# Patient Record
Sex: Female | Born: 1980 | Race: Black or African American | Hispanic: No | Marital: Single | Smoking: Never smoker
Health system: Southern US, Community
[De-identification: ages and names within clinical notes are randomized; demographics above are authoritative.]

## PROBLEM LIST (undated history)

## (undated) DIAGNOSIS — F419 Anxiety disorder, unspecified: Secondary | ICD-10-CM

## (undated) DIAGNOSIS — T7840XA Allergy, unspecified, initial encounter: Secondary | ICD-10-CM

## (undated) HISTORY — PX: COSMETIC SURGERY: SHX468

## (undated) HISTORY — DX: Anxiety disorder, unspecified: F41.9

## (undated) HISTORY — PX: BREAST SURGERY: SHX581

## (undated) HISTORY — DX: Allergy, unspecified, initial encounter: T78.40XA

---

## 2005-05-05 ENCOUNTER — Inpatient Hospital Stay (HOSPITAL_COMMUNITY): Admission: AD | Admit: 2005-05-05 | Discharge: 2005-05-05 | Payer: Self-pay | Admitting: Obstetrics and Gynecology

## 2005-08-01 ENCOUNTER — Inpatient Hospital Stay (HOSPITAL_COMMUNITY): Admission: AD | Admit: 2005-08-01 | Discharge: 2005-08-01 | Payer: Self-pay | Admitting: *Deleted

## 2005-09-03 ENCOUNTER — Other Ambulatory Visit: Admission: RE | Admit: 2005-09-03 | Discharge: 2005-09-03 | Payer: Self-pay | Admitting: Obstetrics and Gynecology

## 2005-09-10 ENCOUNTER — Encounter: Admission: RE | Admit: 2005-09-10 | Discharge: 2005-09-10 | Payer: Self-pay | Admitting: Obstetrics and Gynecology

## 2005-09-25 ENCOUNTER — Ambulatory Visit (HOSPITAL_COMMUNITY): Admission: RE | Admit: 2005-09-25 | Discharge: 2005-09-25 | Payer: Self-pay | Admitting: General Surgery

## 2005-09-25 ENCOUNTER — Encounter (INDEPENDENT_AMBULATORY_CARE_PROVIDER_SITE_OTHER): Payer: Self-pay | Admitting: *Deleted

## 2005-09-25 ENCOUNTER — Ambulatory Visit (HOSPITAL_BASED_OUTPATIENT_CLINIC_OR_DEPARTMENT_OTHER): Admission: RE | Admit: 2005-09-25 | Discharge: 2005-09-25 | Payer: Self-pay | Admitting: General Surgery

## 2006-07-15 ENCOUNTER — Emergency Department (HOSPITAL_COMMUNITY): Admission: EM | Admit: 2006-07-15 | Discharge: 2006-07-15 | Payer: Self-pay | Admitting: Emergency Medicine

## 2006-10-07 ENCOUNTER — Emergency Department (HOSPITAL_COMMUNITY): Admission: EM | Admit: 2006-10-07 | Discharge: 2006-10-07 | Payer: Self-pay | Admitting: Emergency Medicine

## 2007-12-15 ENCOUNTER — Encounter (INDEPENDENT_AMBULATORY_CARE_PROVIDER_SITE_OTHER): Payer: Self-pay | Admitting: Plastic Surgery

## 2007-12-15 ENCOUNTER — Ambulatory Visit (HOSPITAL_BASED_OUTPATIENT_CLINIC_OR_DEPARTMENT_OTHER): Admission: RE | Admit: 2007-12-15 | Discharge: 2007-12-16 | Payer: Self-pay | Admitting: Plastic Surgery

## 2011-05-07 NOTE — Op Note (Signed)
[name], [name]              ACCOUNT NO.:  [id number]   MEDICAL RECORD NO.:  [id number]          PATIENT TYPE:  AMB   LOCATION:  DSC                          FACILITY:  [facility]   PHYSICIAN:  Mary Contogiannis, M.D.DATE OF BIRTH:  05/17/1981   DATE OF PROCEDURE:  12/15/2007  DATE OF DISCHARGE:                               OPERATIVE REPORT   PREOPERATIVE DIAGNOSIS:  Bilateral macromastia.   POSTOPERATIVE DIAGNOSIS:  Bilateral macromastia.   PROCEDURE:  Bilateral reduction mammoplasties.   ATTENDING SURGEON:  Brantley Persons, M.D.   ANESTHESIA:  General endotracheal.   ANESTHESIOLOGIST:  Burna Forts, M.D.   ESTIMATED BLOOD LOSS:  200 mL.   FLUID REPLACEMENT:  2300 mL of crystalloid.   URINE OUTPUT:  600 mL.   COMPLICATIONS:  None.   INDICATIONS FOR PROCEDURE:  The patient is a 30 year old African  American female who has bilateral macromastia that is clinically  symptomatic.  She presents to undergo bilateral reduction mammoplasties.   JUSTIFICATION FOR OVERNIGHT STAY:  Progressive pain control along with  ambulation and monitoring of nipples and breast flaps.   PROCEDURE:  The patient was marked in the preop holding area in the  pattern of Wise for the future bilateral reduction mammoplasties.  She  was then taken back to the OR, placed on the table in a supine position.  After adequate general endotracheal anesthesia was obtained, the  patient's chest and breasts were prepped with Betadine and alcohol and  draped in a sterile fashion.  The base of the breasts had been injected  with 1% lidocaine with epinephrine.  After adequate hemostasis and  anesthesia had taken effect, the procedure was begun.   Both of the breast reductions were performed in the following similar  manner.  The nipple-areolar complexes were marked with the 45-mm nipple  marker.  This was then incised and the skin de-epithelialized around the  nipple-areolar complex down to the  inframammary crease in the inferior  pedicle pattern.  Next the medial, superior and lateral skin flaps were  elevated down to the chest wall.  The excess fat and glandular tissue  were removed from the inferior pedicle.  The nipple-areolar complex was  examined and found to be pink and viable.  The wound was then irrigated  with saline irrigation.  Meticulous hemostasis was obtained with the  Bovie electrocautery.  The inferior pedicle was then centralized using 3-  0 Prolene suture.  A #10 JP flat fully-fluted drain was then placed into  the wound.  The skin flaps were brought together at the inverted T  junction with a 2-0 Prolene suture.  The incisions were then stapled for  temporary closure.   The breasts were compared and found to have good shape and symmetry.  The staples were removed and the incisions were then closed from the  medial aspect of a JP drain to the medial aspect of the [facility] incision,  first placing a few 3-0 Monocryl sutures in the dermal layer and then  both the dermal and cuticular layers were closed in a single layer using  the Quill 2.0 PDO  barbed suture.  Lateral to the JP drain, the incisions  were closed using a 3-0 Monocryl in the dermal layer followed by a 3-0  Monocryl running intracuticular stitch on the skin.   The patient was then placed in the upright position.  The future  location of the nipple-areolar complexes was marked on both breast  mounds with a 42-mm nipple marker.  She was then placed back into the  recumbent position.   Both of the nipple-areolar complexes were then brought out onto the  breast mound in the following similar manner.  The skin was incised as  marked and removed full-thickness through the skin into subcutaneous  tissues.  The nipple-areolar complex was examined and found be pink and  viable, then brought out through this aperture and sewn in place using 4-  0 Monocryl in the dermal layer, followed by 5-0 Monocryl in an   intracuticular stitch on the skin.  The vertical limb of the Wise  pattern incision had been closed in the dermal layer with 3-0 Monocryl  suture and then a 5-0 Monocryl suture was then brought down to close the  cuticular layer of the vertical limb.  The JP drain was sewn in place  using a 3-0 nylon suture.  The skin and soft tissues of the breasts as  well as the chest wall were then injected with 0.5% Marcaine with  epinephrine to provide a postsurgical anesthetic block.  The incisions  were dressed with Benzoin and Steri-Strips and the nipples additionally  with bacitracin ointment and Adaptic.  There were no complications.  The  patient tolerated the procedure well.  The final needle and sponge  counts were reported to be correct at the end of the case.  The patient  was then placed into a light support bra and awakened from the general  anesthesia.  She plans to remain overnight in the [facility] for progressive  pain control along with ambulation and monitoring of the nipples and  breast flaps.  She was awakened from general anesthesia and taken to the  recovery room in stable condition.  If the patient would like to be  discharged from the recovery room she can do that, but if she feels she  needs to stay overnight, this can be done as well.  For now we will plan  for the patient to stay overnight and I will send her home in the  morning.           ______________________________  Brantley Persons, M.D.     MC/MEDQ  D:  12/15/2007  T:  12/15/2007  Job:  [id number]

## 2011-05-10 NOTE — Op Note (Signed)
NAMEALONNA, BARTLING              ACCOUNT NO.:  [id number]   MEDICAL RECORD NO.:  [id number]          PATIENT TYPE:  AMB   LOCATION:  [facility]                         [facility]ACILITY[facility]  [facility]   PHYSICIAN:  Anselm Pancoast. Weatherly, M.D.DATE OF BIRTH:  [birth year]   DATE OF PROCEDURE:  09/25/2005  DATE OF DISCHARGE:                                 OPERATIVE REPORT   PREOPERATIVE DIAGNOSIS:  Left axillary mass.   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   OPERATION:  Excisional biopsy.   ANESTHESIA:  General anesthesia.   SURGEON:  Anselm Pancoast. Zachery Dakins, M.D.   HISTORY:  [name] is a 30 year old black female who has a gradually  increasing mass in the left axilla that she presented to our office with.  The patient has had a previous breast biopsy, a cyst I was told years ago  that was removed by Dr. Lurene Shadow. This was about 5 years earlier. She said  that the area was thought to be a lymph node. It is high in the axilla, but  possibly in the tail of Spence area and certainly is firm and I recommended  that we excise it. It was first thought that this was possibly hidradenitis  but I found no evidence of any inflammation or infection in the some skin  area and the area is a little deeper than that. It measures approximately 3  cm in size and I think it is deep enough that it would be painful to try to  excise it with local anesthesia and recommended general. The patient is here  for the planned procedure. She was given a gram of Kefzol. The area was  marked and there was no change in her physical exam, taken back to the  operative suite. She had received a gram of Kefzol. The axilla was prepped  with Betadine solution and draped in a sterile manner after anesthesia had  been started. The area, a little curved incision, this is kind of at the  lower axillary skin fold. Sharp dissection down through the skin and  subcutaneous tissue and then I  resected just a little bit towards the axilla  inferior or towards the breast area to get over the top of this mass. Then  the surrounding fibrous and fatty tissue some  which looks like it is right  at the most tip of the tail of Spence area. It was all divided between  [facility] and these were individually ligated with 3-0 chromic sutures. The  area completely excise was firm, kind of irregular surface and I sent it  over for pathology exam. Dr. Laureen Ochs cut it, examined it and says it is a  fibroadenoma in the most tip of the tail of Spence area. The subcutaneous  tissue or breast tissue was closed with 3-0 chromic. The skin was closed  with interrupted 4-0 nylon and the surgery completed. Occlusive dressing was  applied and the patient did nicely. She will be released after a short stay  in the recovery room and the sutures will be removed within a week.  I am not  sure who her referring physician was.           ______________________________  Anselm Pancoast. Zachery Dakins, M.D.     WJW/MEDQ  D:  09/25/2005  T:  09/25/2005  Job:  [id number]

## 2011-09-27 LAB — POCT HEMOGLOBIN-HEMACUE: Operator id: 128471

## 2012-03-22 ENCOUNTER — Encounter (HOSPITAL_BASED_OUTPATIENT_CLINIC_OR_DEPARTMENT_OTHER): Payer: Self-pay | Admitting: *Deleted

## 2012-03-22 ENCOUNTER — Emergency Department (HOSPITAL_BASED_OUTPATIENT_CLINIC_OR_DEPARTMENT_OTHER)
Admission: EM | Admit: 2012-03-22 | Discharge: 2012-03-22 | Disposition: A | Discharge status: Home / Self Care | Payer: Self-pay | Attending: Emergency Medicine | Admitting: Emergency Medicine

## 2012-03-22 ENCOUNTER — Other Ambulatory Visit: Payer: Self-pay

## 2012-03-22 DIAGNOSIS — F1290 Cannabis use, unspecified, uncomplicated: Secondary | ICD-10-CM

## 2012-03-22 DIAGNOSIS — F1210 Cannabis abuse, uncomplicated: Secondary | ICD-10-CM | POA: Insufficient documentation

## 2012-03-22 DIAGNOSIS — R209 Unspecified disturbances of skin sensation: Secondary | ICD-10-CM | POA: Insufficient documentation

## 2012-03-22 DIAGNOSIS — F410 Panic disorder [episodic paroxysmal anxiety] without agoraphobia: Secondary | ICD-10-CM | POA: Insufficient documentation

## 2012-03-22 MED ORDER — LORAZEPAM 1 MG PO TABS
2.0000 mg | ORAL_TABLET | Freq: Once | ORAL | Status: AC
  Administered 2012-03-22: 2 mg via ORAL

## 2012-03-22 MED ORDER — LORAZEPAM 1 MG PO TABS
ORAL_TABLET | ORAL | Status: AC
  Filled 2012-03-22: qty 2

## 2012-03-22 NOTE — ED Provider Notes (Signed)
History     CSN: [id number]  Arrival date & time 03/22/12  4098   First MD Initiated Contact with Patient 03/22/12 0541      Chief Complaint  Patient presents with  . Numbness     The history is provided by the patient.   the patient reports she was at a friend's house and they were smoking marijuana.  She then developed pressure in her chest that reminded her of her prior panic attacks.  She then reports that her heart began to race and she developed numbness and tingling on her right side of her body.  She took an aspirin.  She denies shortness of breath nausea vomiting or diaphoresis.  She has no early family history of heart disease.  She does have a history of asthma.  She reports that her whole body started to tremble and has shaking of her arms and legs bilaterally.  Nothing worsens her symptoms.  Nothing improves her symptoms.  Her symptoms are constant.     Past Medical History  Diagnosis Date  . Asthma     History reviewed. No pertinent past surgical history.  No family history on file.  History  Substance Use Topics  . Smoking status: Never Smoker   . Smokeless tobacco: Not on file  . Alcohol Use: Yes    OB History    Grav Para Term Preterm Abortions TAB SAB Ect Mult Living                  Review of Systems  All other systems reviewed and are negative.    Allergies  Review of patient's allergies indicates no known allergies.  Home Medications  No current outpatient prescriptions on file.  BP 150/77  Pulse 108  Temp(Src) 98.3 F (36.8 C) (Oral)  Resp 20  SpO2 100%  LMP 02/12/2012  Physical Exam  Nursing note and vitals reviewed. Constitutional: She is oriented to person, place, and time. She appears well-developed and well-nourished.       Anxious appearing  HENT:  Head: Normocephalic and atraumatic.  Eyes: EOM are normal.  Neck: Normal range of motion.  Cardiovascular: Regular rhythm and normal heart sounds.        Tachycardic rate    Pulmonary/Chest: Effort normal and breath sounds normal.  Abdominal: Soft. She exhibits no distension.  Musculoskeletal: Normal range of motion.  Neurological: She is alert and oriented to person, place, and time.       Normal strength in upper lower extremities  Skin: Skin is warm and dry.  Psychiatric: Judgment normal.       Anxious appearing    ED Course  Procedures (including critical care time)   Date: 03/22/2012  Rate: 113  Rhythm: Sinus tachycardia  QRS Axis: normal  Intervals: normal  ST/T Wave abnormalities: normal  Conduction Disutrbances: none  Narrative Interpretation:   Old EKG Reviewed: No prior EKG available    Labs Reviewed - No data to display No results found.   1. Panic reaction   2. Marijuana use       MDM  The patient's symptoms seem consistent with anxiety secondary to marijuana use.  Her EKG shows a sinus tachycardia.  She has a normal neurologic exam.  She denies use of other illicit drugs.  She denies mixing alcohol with energy drinks.  She reports she has not smoked marijuana in several years and is the first time she has done this in the long time.  6:39 AM The  patient feels much better at this time.  Her chest pressure is significantly improved.  Her trembling is resolved.  Her numbness and tingling has resolved       Lyanne Co, MD 03/22/12 [phone]

## 2012-03-22 NOTE — ED Notes (Addendum)
Patient states that she can't stop trembling. Right side of her body "keeps going numb", and she feels like her heart is beating fast. Also states she is having chest pressure. Started 20 minutes ago, after being up all night and drinking with friends. Took 2 aspirin prior to arrival, but does not know the dosage., states the bottle says "adult aspirin".

## 2015-02-11 ENCOUNTER — Ambulatory Visit (INDEPENDENT_AMBULATORY_CARE_PROVIDER_SITE_OTHER): Payer: BLUE CROSS/BLUE SHIELD | Admitting: Physician Assistant

## 2015-02-11 VITALS — BP 118/80 | HR 81 | Temp 98.0°F | Resp 18 | Ht 68.75 in | Wt 198.2 lb

## 2015-02-11 DIAGNOSIS — R070 Pain in throat: Secondary | ICD-10-CM

## 2015-02-11 DIAGNOSIS — R5383 Other fatigue: Secondary | ICD-10-CM

## 2015-02-11 DIAGNOSIS — R5381 Other malaise: Secondary | ICD-10-CM

## 2015-02-11 DIAGNOSIS — H6691 Otitis media, unspecified, right ear: Secondary | ICD-10-CM

## 2015-02-11 DIAGNOSIS — J069 Acute upper respiratory infection, unspecified: Secondary | ICD-10-CM

## 2015-02-11 DIAGNOSIS — R0981 Nasal congestion: Secondary | ICD-10-CM

## 2015-02-11 LAB — POCT CBC
GRANULOCYTE PERCENT: 69 % (ref 37–80)
HEMATOCRIT: 39.1 % (ref 37.7–47.9)
HEMOGLOBIN: 12.4 g/dL (ref 12.2–16.2)
Lymph, poc: 3.3 (ref 0.6–3.4)
MCH, POC: 29.1 pg (ref 27–31.2)
MCHC: 31.7 g/dL — AB (ref 31.8–35.4)
MCV: 91.7 fL (ref 80–97)
MID (CBC): 0.3 (ref 0–0.9)
MPV: 7.8 fL (ref 0–99.8)
POC Granulocyte: 7.9 — AB (ref 2–6.9)
POC LYMPH PERCENT: 28.3 %L (ref 10–50)
POC MID %: 2.7 %M (ref 0–12)
Platelet Count, POC: 316 10*3/uL (ref 142–424)
RBC: 4.26 M/uL (ref 4.04–5.48)
RDW, POC: 14.8 %
WBC: 11.5 10*3/uL — AB (ref 4.6–10.2)

## 2015-02-11 LAB — POCT RAPID STREP A (OFFICE): RAPID STREP A SCREEN: NEGATIVE

## 2015-02-11 MED ORDER — AZITHROMYCIN 250 MG PO TABS: ORAL_TABLET | ORAL | Status: DC

## 2015-02-11 MED ORDER — IPRATROPIUM BROMIDE 0.03 % NA SOLN: 2.0000 | Freq: Two times a day (BID) | NASAL | Status: DC

## 2015-02-11 MED ORDER — GUAIFENESIN ER 1200 MG PO TB12: 1.0000 | ORAL_TABLET | Freq: Two times a day (BID) | ORAL | Status: DC | PRN

## 2015-02-11 NOTE — Progress Notes (Signed)
[facility] [address], [place] [address] [phone]  Date:  02/11/2015   Name:  [name]   DOB:  03-23-1981   MRN:  [id number]  PCP:  No primary care provider on file.    Chief Complaint: Sinusitis; Sore Throat; and Ear Pain   History of Present Illness:  [name] is a 34 y.o. very pleasant female patient who presents with the following:  Patient reports first symptoms of neck pain and sorethroat 3 days ago.  She then began to have nasal congestion, chills, and weakness.  She denies fever, however.  She woke up yesterday with discharge around the eyes, however this resolved and she has had no abnormal difficulties with her eyes.  She became concerned today when she woke up at 5 am with a sharp right ear pain.  This was accompanied by tinnitus and mild dizziness with the initial presentation of the ear pain.  There was no drainage.  She has developed fatigue over the last 3 days and body aches.She denies facial weakness.  She has no sob, dyspnea, rash.  She has pain with yawning, but no pain with swallowing.  She denies abdominal pain, n/v, or diarrhea.  Patient had full std screening including HIV 2 weeks ago, which was negative.  She works as a Agricultural engineermakeup artist, and has no known sick contacts.     Past Medical History  Diagnosis Date  . Asthma   . Allergy   . Anxiety     Past Surgical History  Procedure Laterality Date  . Breast surgery    . Cosmetic surgery      History  Substance Use Topics  . Smoking status: Never Smoker   . Smokeless tobacco: Not on file  . Alcohol Use: Yes    Family History  Problem Relation Age of Onset  . Mental illness Father   . Hyperlipidemia Maternal Grandfather   . Hypertension Maternal Grandfather   . Cancer Paternal Grandmother     Allergies  Allergen Reactions  . Aspirin Hives and Nausea And Vomiting    Medication list has been reviewed and updated.  No current outpatient prescriptions on  file prior to visit.   No current facility-administered medications on file prior to visit.    Review of Systems: ROS otherwise unremarkable unless listed above.     Physical Examination: Filed Vitals:   02/11/15 1145  BP: 118/80  Pulse: 81  Temp: 98 F (36.7 C)  Resp: 18   Filed Vitals:   02/11/15 1145  Height: 5' 8.75" (1.746 m)  Weight: 198 lb 3.2 oz (89.903 kg)   Body mass index is 29.49 kg/(m^2). Ideal Body Weight: Weight in (lb) to have BMI = 25: 167.7   Physical Exam  Constitutional: She is oriented to person, place, and time. She appears well-developed and well-nourished.  HENT:  Head: Normocephalic and atraumatic.  Right Ear: External ear and ear canal normal. No drainage. Tympanic membrane is injected (Injected TM with bullous-like lesion at 3 o'clock, however more opague than the general bullous.  ).  Left Ear: External ear and ear canal normal. No drainage. Tympanic membrane is injected.  Mouth/Throat: Oropharyngeal exudate (White exudate scattered along the pharynx.  ) present.  Eyes: EOM and lids are normal. Pupils are equal, round, and reactive to light. Right eye exhibits no discharge. Left eye exhibits no discharge. Right conjunctiva is injected. Right conjunctiva has no hemorrhage. Left conjunctiva is injected. Left conjunctiva has  no hemorrhage. No scleral icterus.  Neck: Normal range of motion. Neck supple. No thyromegaly present.  Cardiovascular: Normal rate, regular rhythm and normal heart sounds.   No murmur heard. Abdominal: There is no splenomegaly (No splenomegaly though mildly tender with palpation.).  Lymphadenopathy:       Head (right side): Submandibular and tonsillar adenopathy present. No preauricular, no posterior auricular and no occipital adenopathy present.       Head (left side): Submandibular and tonsillar adenopathy present. No preauricular, no posterior auricular and no occipital adenopathy present.       Right: No supraclavicular  adenopathy present.       Left: No supraclavicular adenopathy present.  Neurological: She is alert and oriented to person, place, and time. No cranial nerve deficit.  Skin: Skin is warm and dry.     Results for orders placed or performed in visit on 02/11/15  POCT CBC  Result Value Ref Range   WBC 11.5 (A) 4.6 - 10.2 K/uL   Lymph, poc 3.3 0.6 - 3.4   POC LYMPH PERCENT 28.3 10 - 50 %L   MID (cbc) 0.3 0 - 0.9   POC MID % 2.7 0 - 12 %M   POC Granulocyte 7.9 (A) 2 - 6.9   Granulocyte percent 69.0 37 - 80 %G   RBC 4.26 4.04 - 5.48 M/uL   Hemoglobin 12.4 12.2 - 16.2 g/dL   HCT, POC 16.1 09.6 - 47.9 %   MCV 91.7 80 - 97 fL   MCH, POC 29.1 27 - 31.2 pg   MCHC 31.7 (A) 31.8 - 35.4 g/dL   RDW, POC 04.5 %   Platelet Count, POC 316 142 - 424 K/uL   MPV 7.8 0 - 99.8 fL  POCT rapid strep A  Result Value Ref Range   Rapid Strep A Screen Negative Negative     Assessment and Plan: 34 year old female is here today for sore throat, ear pain, and sinusitis.  I am treating presumptively to cover for both strep throat and possible bullous myringitis.  Discussed patient to return to clinic if her sxs and fatigue do not improve after treatment.    Malaise and fatigue - Plan: POCT CBC, Culture, Group A Strep  Other fatigue - Plan: Culture, Group A Strep  Throat pain in adult - Plan: POCT rapid strep A, Culture, Group A Strep, azithromycin (ZITHROMAX) 250 MG tablet  Upper respiratory infection - Plan: azithromycin (ZITHROMAX) 250 MG tablet  Subacute otitis media of right ear, recurrence not specified, unspecified otitis media type - Plan: azithromycin (ZITHROMAX) 250 MG tablet  Nasal congestion - Plan: Guaifenesin (MUCINEX MAXIMUM STRENGTH) 1200 MG TB12, ipratropium (ATROVENT) 0.03 % nasal spray  Trena Platt, PA-C [facility] 2/22/20167:53 AM

## 2015-02-11 NOTE — Patient Instructions (Signed)
Please take the antibiotic as instructed.  If your symptoms do not improve over the next week, please return to the clinic.  Please let us know if hearing does not improve in the next 4 days.

## 2015-02-15 ENCOUNTER — Ambulatory Visit (INDEPENDENT_AMBULATORY_CARE_PROVIDER_SITE_OTHER): Payer: BLUE CROSS/BLUE SHIELD | Admitting: Physician Assistant

## 2015-02-15 VITALS — BP 108/68 | HR 76 | Temp 98.6°F | Resp 16 | Ht 68.5 in | Wt 198.0 lb

## 2015-02-15 DIAGNOSIS — R0981 Nasal congestion: Secondary | ICD-10-CM

## 2015-02-15 LAB — CULTURE, GROUP A STREP: Organism ID, Bacteria: NORMAL

## 2015-02-15 MED ORDER — GUAIFENESIN ER 1200 MG PO TB12: 1.0000 | ORAL_TABLET | Freq: Two times a day (BID) | ORAL | Status: DC | PRN

## 2015-02-15 MED ORDER — PSEUDOEPHEDRINE HCL 60 MG PO TABS: 60.0000 mg | ORAL_TABLET | Freq: Four times a day (QID) | ORAL | Status: AC | PRN

## 2015-02-15 NOTE — Progress Notes (Signed)
[facility] [address], [place] [address] [phone]  Date:  02/15/2015   Name:  [name]   DOB:  1981-04-11   MRN:  [id number]  PCP:  No primary care provider on file.    Chief Complaint: Follow-up   History of Present Illness:  Olivia Kaysngela D Hensley is a 34 y.o. very pleasant female patient who presents with the following:  She is here for follow up of nasal congestion, sore throat, and fatigue, and with some hearing loss in right ear.  Patient returns because the hearing has not returned.  Patient states that it still sounds muffled.  She is taking the mucinex as prescribed along with the atrovent.  She reports some intermittent ear pain.  The throat pain has improved.  She continues to feel fatigued.  She denies sob, dyspnea, or fever.    Past Medical History  Diagnosis Date  . Asthma   . Allergy   . Anxiety     Past Surgical History  Procedure Laterality Date  . Breast surgery    . Cosmetic surgery      History  Substance Use Topics  . Smoking status: Never Smoker   . Smokeless tobacco: Not on file  . Alcohol Use: Yes    Family History  Problem Relation Age of Onset  . Mental illness Father   . Hyperlipidemia Maternal Grandfather   . Hypertension Maternal Grandfather   . Cancer Paternal Grandmother     Allergies  Allergen Reactions  . Aspirin Hives and Nausea And Vomiting    Medication list has been reviewed and updated.  Current Outpatient Prescriptions on File Prior to Visit  Medication Sig Dispense Refill  . Guaifenesin (MUCINEX MAXIMUM STRENGTH) 1200 MG TB12 Take 1 tablet (1,200 mg total) by mouth every 12 (twelve) hours as needed. 14 tablet 0  . ipratropium (ATROVENT) 0.03 % nasal spray Place 2 sprays into both nostrils 2 (two) times daily. 30 mL 0  . azithromycin (ZITHROMAX) 250 MG tablet Take 2 tabs PO x 1 dose, then 1 tab PO QD x 4 days (Patient not taking: Reported on 02/15/2015) 6 tablet 0   No current  facility-administered medications on file prior to visit.    Review of Systems: ROS otherwise unremarkable unless given.   Physical Examination: Filed Vitals:   02/15/15 1436  BP: 108/68  Pulse: 76  Temp: 98.6 F (37 C)  Resp: 16   Filed Vitals:   02/15/15 1436  Height: 5' 8.5" (1.74 m)  Weight: 198 lb (89.812 kg)   Body mass index is 29.66 kg/(m^2). Ideal Body Weight: Weight in (lb) to have BMI = 25: 166.5  Physical Exam  Constitutional: She is oriented to person, place, and time. She appears well-developed and well-nourished.  HENT:  Head: Normocephalic and atraumatic.  Right Ear: External ear and ear canal normal.  Left Ear: Tympanic membrane, external ear and ear canal normal.  Mouth/Throat: No oropharyngeal exudate, posterior oropharyngeal edema or posterior oropharyngeal erythema.  TM has erythema, along border, without bulging.  Bullous is no longer visible.    Eyes: Pupils are equal, round, and reactive to light.  Cardiovascular: Normal rate, regular rhythm and normal heart sounds.   Pulmonary/Chest: Effort normal and breath sounds normal. No respiratory distress. She has no wheezes.  Neurological: She is alert and oriented to person, place, and time. No cranial nerve deficit.  Psychiatric: She has a normal mood and affect. Her behavior is normal.  Assessment and Plan: 34 year old female is here today for chief complaint of hearing loss, and fatigue.  This appears eustachian tube likely, and will require some time for drainage for hearing to fully restore.    Nasal congestion - Plan: pseudoephedrine (SUDAFED) 60 MG tablet, Guaifenesin (MUCINEX MAXIMUM STRENGTH) 1200 MG TB12  Trena Platt, PA-C [facility] 2/29/20161:54 PM

## 2015-02-21 ENCOUNTER — Telehealth: Payer: Self-pay

## 2015-02-21 DIAGNOSIS — H6980 Other specified disorders of Eustachian tube, unspecified ear: Secondary | ICD-10-CM

## 2015-02-21 DIAGNOSIS — B379 Candidiasis, unspecified: Secondary | ICD-10-CM

## 2015-02-21 DIAGNOSIS — T3695XA Adverse effect of unspecified systemic antibiotic, initial encounter: Secondary | ICD-10-CM

## 2015-02-21 MED ORDER — FLUCONAZOLE 150 MG PO TABS
150.0000 mg | ORAL_TABLET | Freq: Once | ORAL | Status: DC
Start: 2015-02-21 — End: 2018-07-31

## 2015-02-21 MED ORDER — AMOXICILLIN-POT CLAVULANATE 875-125 MG PO TABS: 1.0000 | ORAL_TABLET | Freq: Two times a day (BID) | ORAL | Status: AC

## 2015-02-21 MED ORDER — PREDNISONE 20 MG PO TABS: 40.0000 mg | ORAL_TABLET | Freq: Every day | ORAL | Status: AC

## 2015-02-21 NOTE — Telephone Encounter (Signed)
Pt stated [name] wanted a callback from her to give an update on her condition. Please call [phone]

## 2015-02-21 NOTE — Telephone Encounter (Signed)
Contacted patient back:  Patient states that she feels like her sinus congestion is returning.  Her hearing has decreased.  She has nasal drainage of yellow and green mucus.  She denies fever or chills.  She continues to feel very fatigued.  She has no dizziness.     A/P: Will treat with augmentin for 10 days.  Advised patient to contact us if a rash presents while using antibiotic.  If this does not resolve, will test for mono.    Eustachian tube dysfunction, unspecified laterality - Plan: predniSONE (DELTASONE) 20 MG tablet, amoxicillin-clavulanate (AUGMENTIN) 875-125 MG per tablet  Antibiotic-induced yeast infection - Plan: fluconazole (DIFLUCAN) 150 MG tablet  Trena PlattStephanie Jlyn Bracamonte, PA-C [facility] 3/1/20165:40 PM

## 2015-02-21 NOTE — Telephone Encounter (Signed)
Spoke with pt, she states her sinus infection is coming back. She is congested again and mucus has color. She also states both of her ears feels infected and her hearing is gradually decreasing. No fever but she is really tired.

## 2015-04-03 ENCOUNTER — Telehealth: Payer: Self-pay

## 2015-04-03 DIAGNOSIS — H6980 Other specified disorders of Eustachian tube, unspecified ear: Secondary | ICD-10-CM

## 2015-04-03 NOTE — Telephone Encounter (Signed)
Pt was seen last month with sinus infection with ear infection. Now she is experiencing hearing loss from the infection. She states there is also a sinus cavity that is behind her nose that is producing clear mucous, but it runs down her throat. It gives her headaches. She says when she talks it sounds like she is clearing her throat. Please advise.

## 2015-04-03 NOTE — Telephone Encounter (Signed)
Referral made 

## 2015-04-03 NOTE — Telephone Encounter (Signed)
lmom that referral had been made.

## 2015-04-03 NOTE — Telephone Encounter (Signed)
Pt is needing to talk with someone about getting an ent referral

## 2015-08-17 ENCOUNTER — Telehealth (HOSPITAL_COMMUNITY): Payer: Self-pay | Admitting: *Deleted

## 2015-08-17 NOTE — Telephone Encounter (Signed)
Preadmission screen  

## 2018-07-30 ENCOUNTER — Emergency Department (HOSPITAL_BASED_OUTPATIENT_CLINIC_OR_DEPARTMENT_OTHER): Payer: Self-pay

## 2018-07-30 ENCOUNTER — Emergency Department (HOSPITAL_BASED_OUTPATIENT_CLINIC_OR_DEPARTMENT_OTHER)
Admission: EM | Admit: 2018-07-30 | Discharge: 2018-07-31 | Disposition: A | Discharge status: Home / Self Care | Payer: Self-pay | Attending: Emergency Medicine | Admitting: Emergency Medicine

## 2018-07-30 ENCOUNTER — Encounter (HOSPITAL_BASED_OUTPATIENT_CLINIC_OR_DEPARTMENT_OTHER): Payer: Self-pay | Admitting: Adult Health

## 2018-07-30 ENCOUNTER — Other Ambulatory Visit: Payer: Self-pay

## 2018-07-30 DIAGNOSIS — J45909 Unspecified asthma, uncomplicated: Secondary | ICD-10-CM | POA: Insufficient documentation

## 2018-07-30 DIAGNOSIS — R51 Headache: Secondary | ICD-10-CM | POA: Insufficient documentation

## 2018-07-30 DIAGNOSIS — Z79899 Other long term (current) drug therapy: Secondary | ICD-10-CM | POA: Insufficient documentation

## 2018-07-30 DIAGNOSIS — G4452 New daily persistent headache (NDPH): Secondary | ICD-10-CM

## 2018-07-30 NOTE — ED Triage Notes (Signed)
Presents with headache and dizziness that are positional only since June. The pain is described as splitting headache when bending over, coughing, laughing or talking loudly. Today she became concerned because her right eye has a popped blood vessel. She is supposed to get a head CT but her insurance will not cover it. SHe has been seen and treated by her PMD multiple times with no change.

## 2018-07-30 NOTE — ED Provider Notes (Signed)
[facility] EMERGENCY DEPARTMENT Provider Note   CSN: [id number] Arrival date & time: 07/30/18  2032     History   Chief Complaint Chief Complaint  Patient presents with  . Headache    HPI SHANIGUA GIBB is a 37 y.o. female.  37 y/o female with a PMH of anxiety, asthma presents to the ED with a chief complaint of headache x 2 months.  Patient describes the headache as positional, especially when she bends over, sneezes, coughs, laughs. She describes the headache as splitting nature mainly around her sinus area. Patient states she was recently treated for a sinus infection and prescribed antibiotics and prednisone, she completed the course but the headache persisted. Patient was alarmed today because she noticed a red blood vessel on her right eye. Patient was originally scheduled to get a CT head but her insurance would not cover the procedure unless it was preventative.Patient also reports some rhinorrhea, but no previous history of allergies. She denies any blurry vision, diplopia,chest pain, shortness of breath or abdominal pain.      Past Medical History:  Diagnosis Date  . Allergy   . Anxiety   . Asthma     There are no active problems to display for this patient.   Past Surgical History:  Procedure Laterality Date  . BREAST SURGERY    . COSMETIC SURGERY       OB History   None      Home Medications    Prior to Admission medications   Medication Sig Start Date End Date Taking? Authorizing Provider  albuterol (VENTOLIN HFA) 108 (90 Base) MCG/ACT inhaler Inhale into the lungs. 02/03/18  Yes [provider]  ALPRAZolam Prudy Feeler) 0.5 MG tablet Take by mouth. 02/06/18  Yes [provider]  beclomethasone (QVAR) 80 MCG/ACT inhaler Inhale into the lungs. 02/03/18  Yes [provider]  Norethindrone-Ethinyl Estradiol-Fe Biphas (LO LOESTRIN FE) 1 MG-10 MCG / 10 MCG tablet 1 po daily. 02/11/18  Yes [provider]  azithromycin  (ZITHROMAX) 250 MG tablet Take 2 tabs PO x 1 dose, then 1 tab PO QD x 4 days Patient not taking: Reported on 02/15/2015 02/11/15   Trena Platt D, PA  fluconazole (DIFLUCAN) 150 MG tablet Take 1 tablet (150 mg total) by mouth once. Repeat if needed 02/21/15   English, Stephanie D, PA  Guaifenesin [facility] MAXIMUM STRENGTH) 1200 MG TB12 Take 1 tablet (1,200 mg total) by mouth every 12 (twelve) hours as needed. 02/15/15   Trena Platt D, PA  ipratropium (ATROVENT) 0.03 % nasal spray Place 2 sprays into both nostrils 2 (two) times daily. 02/11/15   Garnetta Buddy, PA    Family History Family History  Problem Relation Age of Onset  . Mental illness Father   . Hyperlipidemia Maternal Grandfather   . Hypertension Maternal Grandfather   . Cancer Paternal Grandmother     Social History Social History   Tobacco Use  . Smoking status: Never Smoker  Substance Use Topics  . Alcohol use: Yes  . Drug use: Yes    Types: Marijuana     Allergies   Aspirin   Review of Systems Review of Systems  Constitutional: Negative for chills and fever.  HENT: Positive for rhinorrhea. Negative for ear pain, sinus pressure, sinus pain and sore throat.   Eyes: Positive for redness. Negative for pain and visual disturbance.  Respiratory: Negative for cough and shortness of breath.   Cardiovascular: Negative for chest pain and palpitations.  Gastrointestinal: Negative for abdominal pain and vomiting.  Genitourinary: Negative for dysuria and hematuria.  Musculoskeletal: Negative for arthralgias and back pain.  Skin: Negative for color change and rash.  Neurological: Positive for headaches. Negative for seizures and syncope.  All other systems reviewed and are negative.    Physical Exam Updated Vital Signs BP 120/71 (BP Location: Right Arm)   Pulse 81   Temp 98.5 F (36.9 C) (Oral)   Resp 16   Ht 5\' 8"  (1.727 m)   Wt 87.1 kg   SpO2 100%   BMI 29.19 kg/m   Physical Exam    Constitutional: She is oriented to person, place, and time. She appears well-developed and well-nourished. No distress.  HENT:  Head: Normocephalic and atraumatic.  Mouth/Throat: Oropharynx is clear and moist. No oropharyngeal exudate.  Eyes: Pupils are equal, round, and reactive to light. Right conjunctiva has a hemorrhage. Right pupil is reactive. Left pupil is reactive.  Subconjunctival hemorrhage on the upper portion of the right eye.  Neck: Normal range of motion.  Cardiovascular: Regular rhythm and normal heart sounds.  Pulmonary/Chest: Effort normal and breath sounds normal. No respiratory distress.  Abdominal: Soft. Bowel sounds are normal. She exhibits no distension. There is no tenderness.  Musculoskeletal: She exhibits no tenderness or deformity.       Right lower leg: She exhibits no edema.       Left lower leg: She exhibits no edema.  Neurological: She is alert and oriented to person, place, and time. She has normal strength. GCS eye subscore is 4. GCS verbal subscore is 5. GCS motor subscore is 6.  Skin: Skin is warm and dry.  Psychiatric: She has a normal mood and affect.  Nursing note and vitals reviewed.    ED Treatments / Results  Labs (all labs ordered are listed, but only abnormal results are displayed) Labs Reviewed - No data to display  EKG None  Radiology No results found.  Procedures Procedures (including critical care time)  Medications Ordered in ED Medications - No data to display   Initial Impression / Assessment and Plan / ED Course  I have reviewed the triage vital signs and the nursing notes.  Pertinent labs & imaging results that were available during my care of the patient were reviewed by me and considered in my medical decision making (see chart for details).     Patient presents to the ED with a HA x 4 months.  She was originally treated for a sinus infection with antibiotics and steroids.  She continued to feel the positional  headache with movement along with some dizziness.  She states she was recently evaluated by her PCP who ordered a CT head, patient went to get CT done she was told that her insurance will not cover for it because it was not preventative. Patient presents to the ED today with no acute headache but states she noticed a subconjunctival hemorrhage on her right eye and this alarmed her to come in.Patient at this time is asymptomatic. Will order CT head  to rule out any bleed, mass or infarct. CT Head w/o contrast showed  Ill-defined posterior subcortical areas of edema involving the  posterior parietal and occipital lobes. This could potentially  represent stigmata of posterior reversible encephalopathy syndrome  (PRES) especially if the patient has underlying hypertension. This  can be further confirmed and/or evaluated with MRI   MRI is recommended at this time patient will be trasnferred to [facility] for MRI brain.  11:52 PMSpoke to Dr. Deretha EmoryZackowski, patient will be transfer [facility] ED for MRI Brain w.o contrast.  Final Clinical Impressions(s) / ED Diagnoses   Final diagnoses:  New daily persistent headache    ED Discharge Orders    None       Claude MangesSoto, Jackey Housey, PA-C 07/30/18 2359    Jacalyn LefevreHaviland, Julie, MD 07/31/18 1557

## 2018-07-31 ENCOUNTER — Encounter (HOSPITAL_BASED_OUTPATIENT_CLINIC_OR_DEPARTMENT_OTHER): Payer: Self-pay

## 2018-07-31 ENCOUNTER — Emergency Department (HOSPITAL_COMMUNITY): Payer: Self-pay

## 2018-07-31 MED ORDER — IBUPROFEN 200 MG PO TABS
600.0000 mg | ORAL_TABLET | Freq: Once | ORAL | Status: AC
Start: 2018-07-31 — End: 2018-07-31
  Administered 2018-07-31: 600 mg via ORAL
  Filled 2018-07-31: qty 1

## 2018-07-31 NOTE — ED Provider Notes (Signed)
Ct Head Wo Contrast  Result Date: 07/30/2018 CLINICAL DATA:  Headache and dizziness since June. EXAM: CT HEAD WITHOUT CONTRAST TECHNIQUE: Contiguous axial images were obtained from the base of the skull through the vertex without intravenous contrast. COMPARISON:  None. FINDINGS: BRAIN: The ventricles and sulci are normal. No intraparenchymal hemorrhage, mass effect nor midline shift. No acute large vascular territory infarcts. Suggestion of subcortical hypodensities in the posterior parietal and occipital lobes without hemorrhage. Findings can be seen in acute hypertensive encephalopathy/posterior reversible encephalopathy syndrome (PRES) especially if the patient has an underlying history of hypertension. The basal ganglia are unremarkable. No abnormal extra-axial fluid collections. Basal cisterns are not effaced and midline. The brainstem and cerebellar hemispheres are without acute abnormalities. VASCULAR: Unremarkable. SKULL/SOFT TISSUES: No skull fracture. Small focus of osteopenia involving the right anterior parietal skull, nonspecific. No aggressive osseous lesions. No significant soft tissue swelling. ORBITS/SINUSES: The included ocular globes and orbital contents are normal.The mastoid air cells are clear. The included paranasal sinuses are well-aerated. OTHER: None. IMPRESSION: Ill-defined posterior subcortical areas of edema involving the posterior parietal and occipital lobes. This could potentially represent stigmata of posterior reversible encephalopathy syndrome (PRES) especially if the patient has underlying hypertension. This can be further confirmed and/or evaluated with MRI. Electronically Signed   By: Tollie Ethavid  Kwon M.D.   On: 07/30/2018 23:34   Mr Brain Wo Contrast  Result Date: 07/31/2018 CLINICAL DATA:  Acute headache with normal neurologic examination. Headache is positional and worse with sneezing or coughing. EXAM: MRI HEAD WITHOUT CONTRAST TECHNIQUE: Multiplanar, multiecho pulse  sequences of the brain and surrounding structures were obtained without intravenous contrast. COMPARISON:  Head CT 07/30/2018 FINDINGS: BRAIN: There is no acute infarct, acute hemorrhage or mass effect. The midline structures are normal. There are no old infarcts. The white matter signal is normal for the patient's age. The CSF spaces are normal for age, with no hydrocephalus. Susceptibility-sensitive sequences show no chronic microhemorrhage or superficial siderosis. VASCULAR: Major intracranial arterial and venous sinus flow voids are preserved. SKULL AND UPPER CERVICAL SPINE: The visualized skull base, calvarium, upper cervical spine and extracranial soft tissues are normal. SINUSES/ORBITS: No fluid levels or advanced mucosal thickening. No mastoid or middle ear effusion. The orbits are normal. IMPRESSION: Normal brain MRI. Electronically Signed   By: Deatra RobinsonKevin  Herman M.D.   On: 07/31/2018 04:39     MRI without acute abnormality.  Headache improved.  Discharged home in good condition.  Patient will need outpatient neurology follow-up given her new headaches over the past 2 months.   Azalia Bilisampos, Jayshun Galentine, MD 07/31/18 [phone]

## 2018-08-19 IMAGING — MR MR HEAD W/O CM
10 of 11 series · 42 of 48 positions shown · non-contrast
Comparison: Head CT 07/30/2018

CLINICAL DATA: Acute headache with normal neurologic examination.
Headache is positional and worse with sneezing or coughing.

EXAM:
MRI HEAD WITHOUT CONTRAST
TECHNIQUE: Multiplanar, multiecho pulse sequences of the brain and surrounding
structures were obtained without intravenous contrast.

[Series 5: DWI · axial · 4.0mm · 0.88mm/px · z∈[-97,+41]mm · 7 of 72 slices shown (1 of 4)]
[im 1/72]
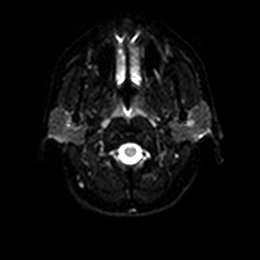
[im 12/72]
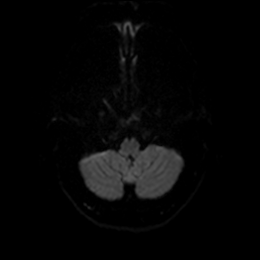
[im 24/72]
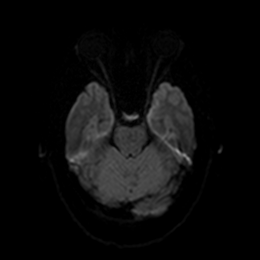
[im 36/72]
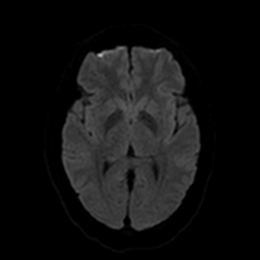
[im 48/72]
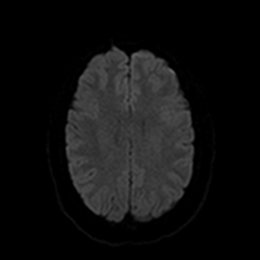
[im 60/72]
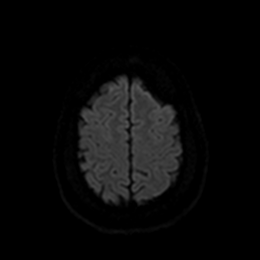
[im 72/72]
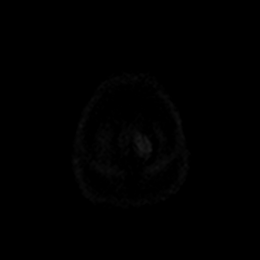

[Series 6: DWI · axial · 4.0mm · 0.88mm/px · z∈[-97,+41]mm · 4 of 36 slices shown (2 of 4)]
[im 1/36]
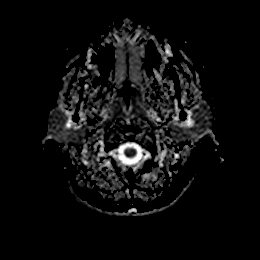
[im 12/36]
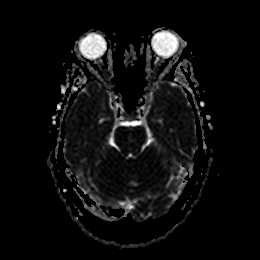
[im 24/36]
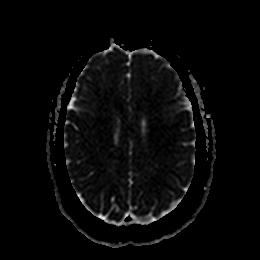
[im 36/36]
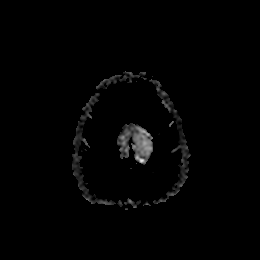

[Series 7: DWI · coronal · 4.0mm · 0.88mm/px · 6 of 64 slices shown (3 of 4)]
[im 1/64]
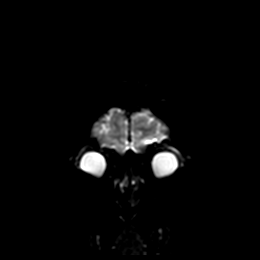
[im 13/64]
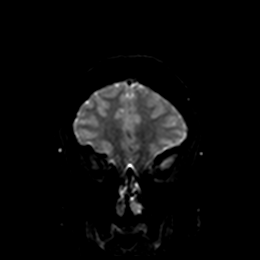
[im 26/64]
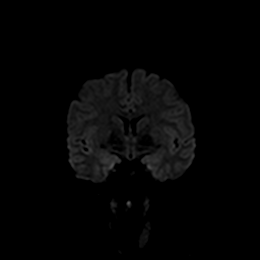
[im 38/64]
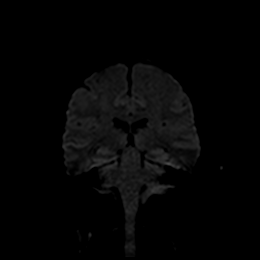
[im 51/64]
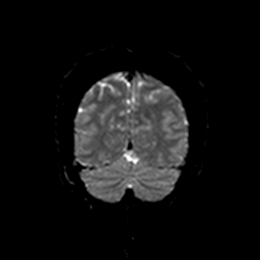
[im 64/64]
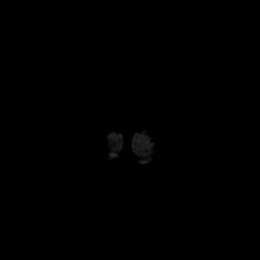

[Series 8: DWI · coronal · 4.0mm · 0.88mm/px · 3 of 32 slices shown (4 of 4)]
[im 1/32]
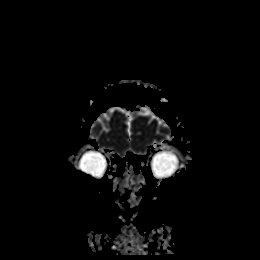
[im 16/32]
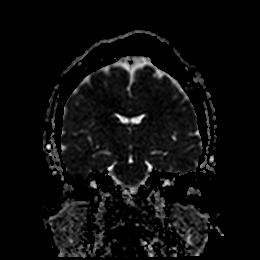
[im 32/32]
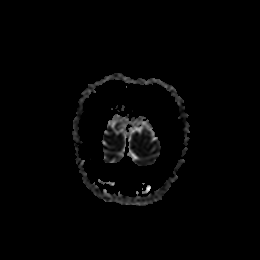

[Series 9: T1 · sagittal · 5.0mm · 0.75mm/px · 2 of 23 slices shown]
[im 1/23]
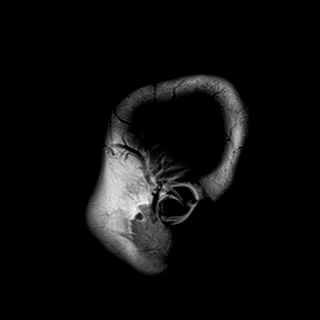
[im 23/23]
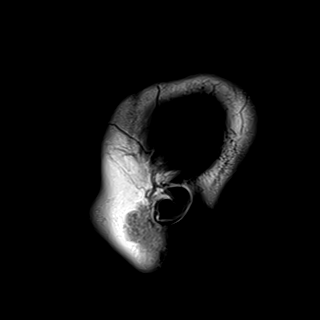

[Series 10: T2 · axial · 5.0mm · 0.72mm/px · z∈[-98,+43]mm · 3 of 25 slices shown (1 of 2)]
[im 1/25]
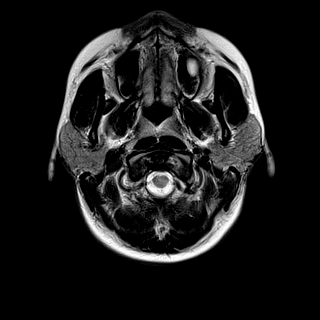
[im 13/25]
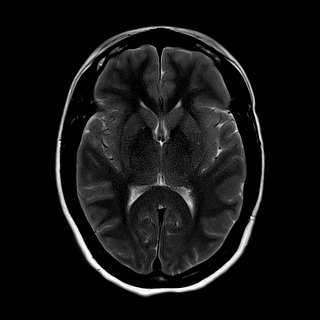
[im 25/25]
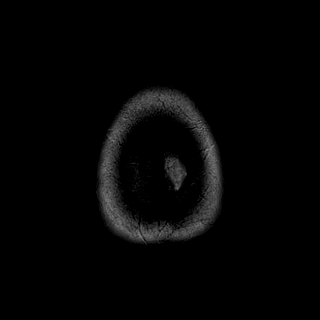

[Series 11: FLAIR · axial · 5.0mm · 0.45mm/px · z∈[-96,+46]mm · 3 of 25 slices shown]
[im 1/25]
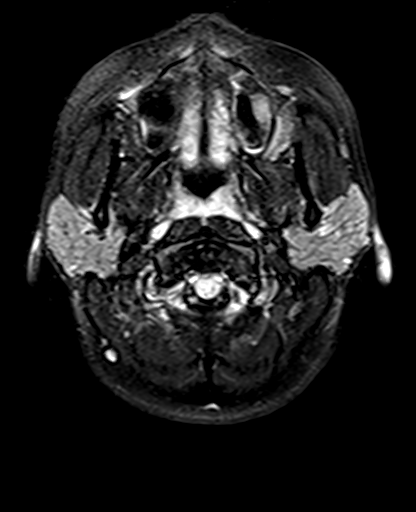
[im 13/25]
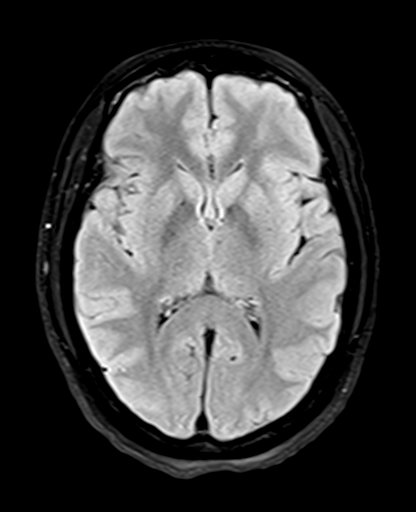
[im 25/25]
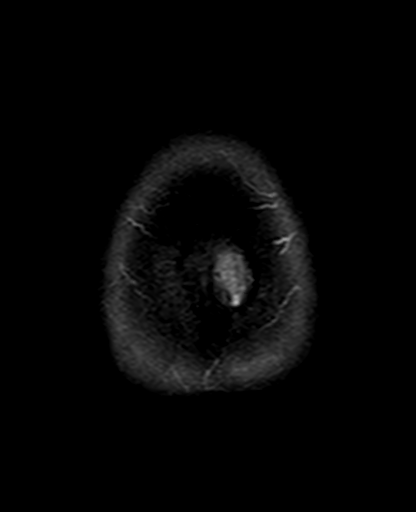

[Series 12: swi_images · axial · 3.0mm · 0.90mm/px · z∈[-105,+69]mm · 6 of 60 slices shown]
[im 1/60]
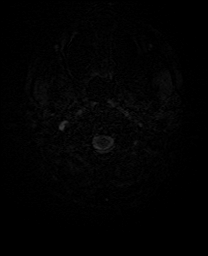
[im 12/60]
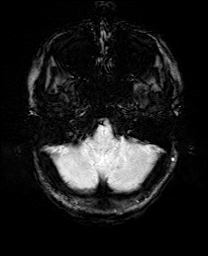
[im 24/60]
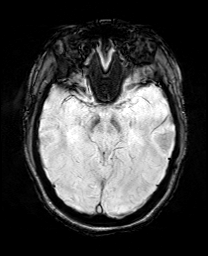
[im 36/60]
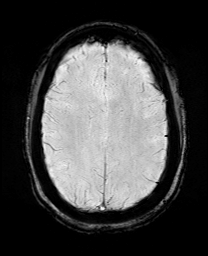
[im 48/60]
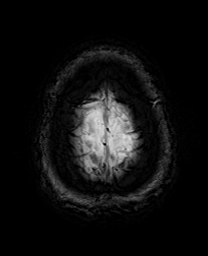
[im 60/60]
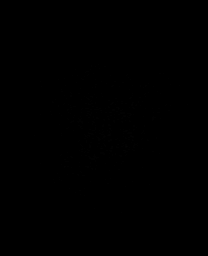

[Series 13: mip_images(sw) · axial · 24.0mm · 0.90mm/px · z∈[-94,+59]mm · 5 of 53 slices shown]
[im 1/53]
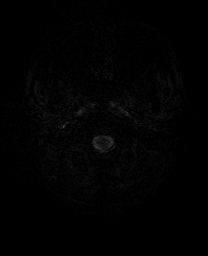
[im 14/53]
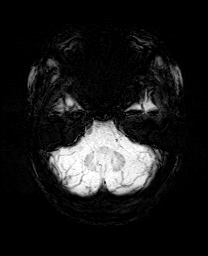
[im 27/53]
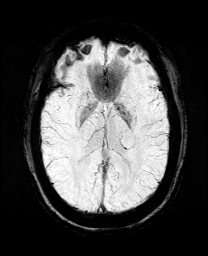
[im 40/53]
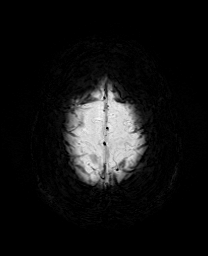
[im 53/53]
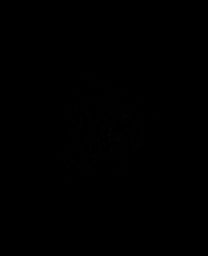

[Series 15: T2 · coronal · 5.0mm · 0.34mm/px · 3 of 29 slices shown (2 of 2)]
[im 1/29]
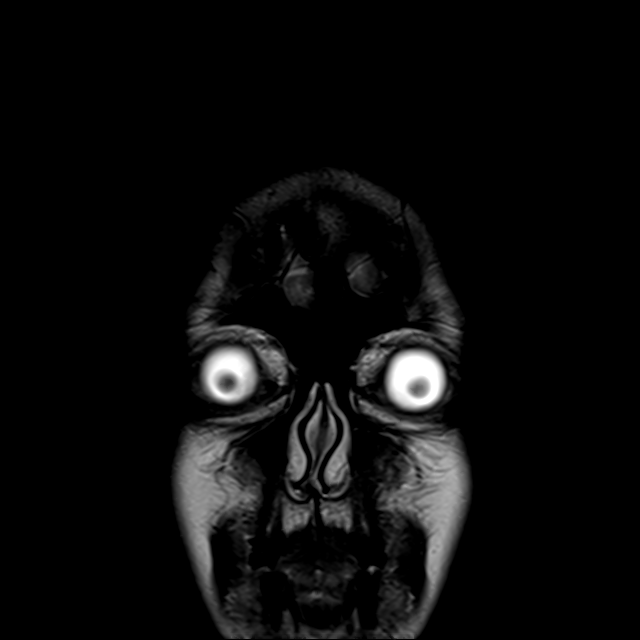
[im 15/29]
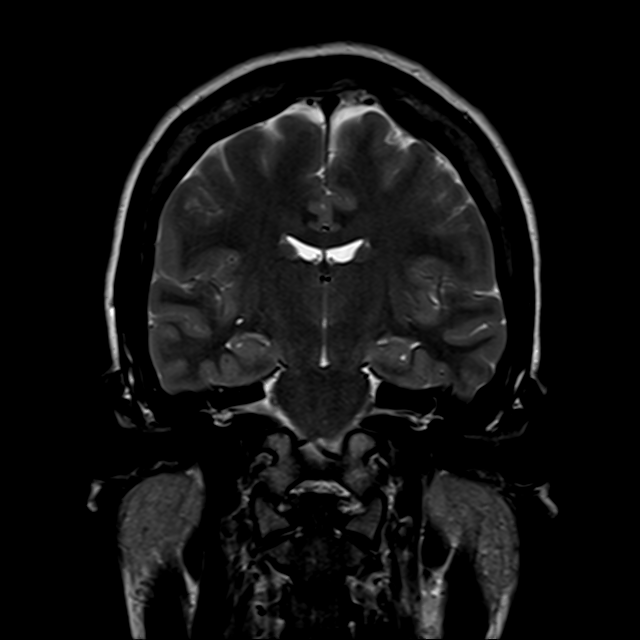
[im 29/29]
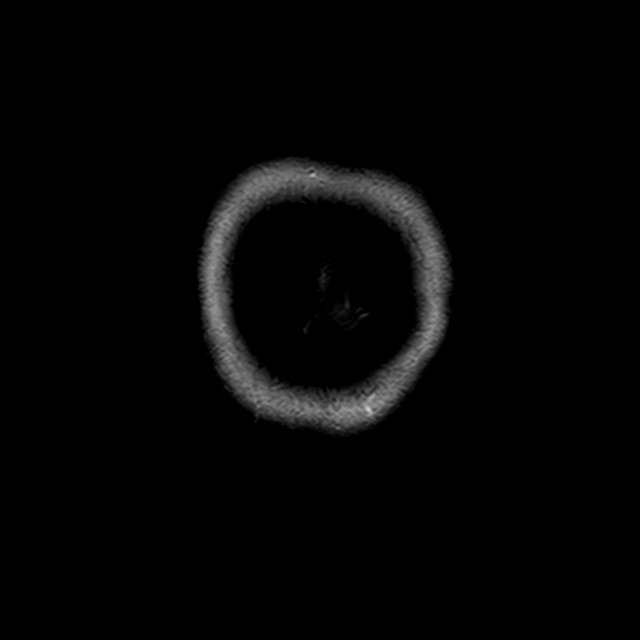

[42 of 48 positions shown; findings below may reference images not displayed]

FINDINGS: BRAIN: There is no acute infarct, acute hemorrhage or mass effect.
The midline structures are normal. There are no old infarcts. The
white matter signal is normal for the patient's age. The CSF spaces
are normal for age, with no hydrocephalus. Susceptibility-sensitive
sequences show no chronic microhemorrhage or superficial siderosis.

VASCULAR: Major intracranial arterial and venous sinus flow voids
are preserved.

SKULL AND UPPER CERVICAL SPINE: The visualized skull base,
calvarium, upper cervical spine and extracranial soft tissues are
normal.

SINUSES/ORBITS: No fluid levels or advanced mucosal thickening. No
mastoid or middle ear effusion. The orbits are normal.
IMPRESSION: Normal brain MRI.

## 2018-09-25 ENCOUNTER — Emergency Department (HOSPITAL_BASED_OUTPATIENT_CLINIC_OR_DEPARTMENT_OTHER)
Admission: EM | Admit: 2018-09-25 | Discharge: 2018-09-25 | Disposition: A | Discharge status: Home / Self Care | Payer: BLUE CROSS/BLUE SHIELD | Attending: Emergency Medicine | Admitting: Emergency Medicine

## 2018-09-25 ENCOUNTER — Other Ambulatory Visit: Payer: Self-pay

## 2018-09-25 ENCOUNTER — Encounter (HOSPITAL_BASED_OUTPATIENT_CLINIC_OR_DEPARTMENT_OTHER): Payer: Self-pay | Admitting: *Deleted

## 2018-09-25 DIAGNOSIS — J45909 Unspecified asthma, uncomplicated: Secondary | ICD-10-CM | POA: Insufficient documentation

## 2018-09-25 DIAGNOSIS — Z79899 Other long term (current) drug therapy: Secondary | ICD-10-CM | POA: Diagnosis not present

## 2018-09-25 DIAGNOSIS — R6889 Other general symptoms and signs: Secondary | ICD-10-CM | POA: Diagnosis present

## 2018-09-25 DIAGNOSIS — R449 Unspecified symptoms and signs involving general sensations and perceptions: Secondary | ICD-10-CM

## 2018-09-25 DIAGNOSIS — Z3202 Encounter for pregnancy test, result negative: Secondary | ICD-10-CM | POA: Insufficient documentation

## 2018-09-25 LAB — BASIC METABOLIC PANEL
ANION GAP: 7 (ref 5–15)
BUN: 14 mg/dL (ref 6–20)
CALCIUM: 8.9 mg/dL (ref 8.9–10.3)
CHLORIDE: 108 mmol/L (ref 98–111)
CO2: 24 mmol/L (ref 22–32)
CREATININE: 0.88 mg/dL (ref 0.44–1.00)
Glucose, Bld: 111 mg/dL — ABNORMAL HIGH (ref 70–99)
Potassium: 3.8 mmol/L (ref 3.5–5.1)
SODIUM: 139 mmol/L (ref 135–145)

## 2018-09-25 LAB — CBC WITH DIFFERENTIAL/PLATELET
BASOS ABS: 0 10*3/uL (ref 0.0–0.1)
BASOS PCT: 0 %
EOS ABS: 0.1 10*3/uL (ref 0.0–0.7)
EOS PCT: 1 %
HCT: 38.3 % (ref 36.0–46.0)
HEMOGLOBIN: 13.3 g/dL (ref 12.0–15.0)
Lymphocytes Relative: 31 %
Lymphs Abs: 2.5 10*3/uL (ref 0.7–4.0)
MCH: 32 pg (ref 26.0–34.0)
MCHC: 34.7 g/dL (ref 30.0–36.0)
MCV: 92.1 fL (ref 78.0–100.0)
Monocytes Absolute: 0.5 10*3/uL (ref 0.1–1.0)
Monocytes Relative: 7 %
NEUTROS PCT: 61 %
Neutro Abs: 4.8 10*3/uL (ref 1.7–7.7)
PLATELETS: 303 10*3/uL (ref 150–400)
RBC: 4.16 MIL/uL (ref 3.87–5.11)
RDW: 11.6 % (ref 11.5–15.5)
WBC: 7.9 10*3/uL (ref 4.0–10.5)

## 2018-09-25 LAB — URINALYSIS, MICROSCOPIC (REFLEX): WBC UA: NONE SEEN WBC/hpf (ref 0–5)

## 2018-09-25 LAB — PREGNANCY, URINE: Preg Test, Ur: NEGATIVE

## 2018-09-25 LAB — URINALYSIS, ROUTINE W REFLEX MICROSCOPIC
BILIRUBIN URINE: NEGATIVE
Glucose, UA: NEGATIVE mg/dL
Ketones, ur: NEGATIVE mg/dL
LEUKOCYTES UA: NEGATIVE
NITRITE: NEGATIVE
PH: 7.5 (ref 5.0–8.0)
Protein, ur: NEGATIVE mg/dL
SPECIFIC GRAVITY, URINE: 1.01 (ref 1.005–1.030)

## 2018-09-25 NOTE — Discharge Instructions (Addendum)
As we discussed today I am unable to tell you what caused your abnormal feelings in event earlier today.  Please do not drive, operate heavy machinery, work at heights, swim, or perform any other tasks that would be dangerous to you or anyone else if you were to have another 1 of these episodes.  You may resume these activities once cleared by your neurologist or primary care doctor.

## 2018-09-25 NOTE — ED Triage Notes (Addendum)
While talking today she was unable to speak or move for 10 seconds suddenly. No energy afterward. She is alert and ambulatory on arrival. No LOC or urinary incontinence. No hx of seizures.

## 2018-09-25 NOTE — ED Provider Notes (Signed)
[facility] EMERGENCY DEPARTMENT Provider Note   CSN: [id number] Arrival date & time: 09/25/18  1354     History   Chief Complaint No chief complaint on file.   HPI [name] is a 37 y.o. female with past medical history of migraines, chronic migraine, exertional headaches who presents today for evaluation of a period of approximately 10 seconds that was abnormal.  She reports that she was sitting at her computer at work when she all of a sudden felt like she was paralyzed, could not move and could not speak.  She says this lasted for about 10 seconds, she did not pass out and says she remained fully aware during this time.  She slid down in her chair however denies falling or striking her head.   She reports that since then she has been feeling very tired.  Her speech returned to normal, she denies any localized weakness, rather feels generally weak, she has been ambulatory, denies dizziness.    She sees a neurologist from [facility].  On Wednesday she had a medication change from Topamax to Trokendi XR from side effects.  She reports that she did not have any abnormal episode yesterday.  She has never had seizures before.  Chart review shows that she has had an MRI this year without mass.  No headache, N/V, no CP, Shob.  No positional changes prior to episode.   HPI  Past Medical History:  Diagnosis Date  . Allergy   . Anxiety   . Asthma     There are no active problems to display for this patient.   Past Surgical History:  Procedure Laterality Date  . BREAST SURGERY    . COSMETIC SURGERY       OB History   None      Home Medications    Prior to Admission medications   Medication Sig Start Date End Date Taking? Authorizing Provider  albuterol (VENTOLIN HFA) 108 (90 Base) MCG/ACT inhaler Inhale 1-2 puffs into the lungs every 6 (six) hours as needed for wheezing.  02/03/18   [provider]  ALPRAZolam Prudy Feeler) 0.5 MG tablet Take 0.5 mg by  mouth 2 (two) times daily as needed for anxiety.  02/06/18   [provider]  beclomethasone (QVAR) 80 MCG/ACT inhaler Inhale 1 puff into the lungs 2 (two) times daily.  02/03/18   [provider]  Norethindrone-Ethinyl Estradiol-Fe Biphas (LO LOESTRIN FE) 1 MG-10 MCG / 10 MCG tablet Take 1 tablet by mouth daily.  02/11/18   [provider]    Family History Family History  Problem Relation Age of Onset  . Mental illness Father   . Hyperlipidemia Maternal Grandfather   . Hypertension Maternal Grandfather   . Cancer Paternal Grandmother     Social History Social History   Tobacco Use  . Smoking status: Never Smoker  . Smokeless tobacco: Never Used  Substance Use Topics  . Alcohol use: Yes  . Drug use: Yes    Types: Marijuana     Allergies   Aspirin   Review of Systems Review of Systems  Constitutional: Positive for fatigue. Negative for activity change and fever.  Respiratory: Negative for chest tightness and shortness of breath.   Cardiovascular: Negative for chest pain.  Gastrointestinal: Negative for abdominal pain, diarrhea, nausea and vomiting.  Genitourinary: Negative for dysuria.  Neurological: Positive for weakness (Generalized).       10 seconds of inability to move or speak  All other  systems reviewed and are negative.    Physical Exam Updated Vital Signs BP 125/81 (BP Location: Right Arm)   Pulse 78   Temp 98.3 F (36.8 C) (Oral)   Resp 16   Ht 5\' 7"  (1.702 m)   Wt 87.5 kg   SpO2 100%   BMI 30.23 kg/m   Physical Exam  Constitutional: She is oriented to person, place, and time. She appears well-developed and well-nourished. No distress.  HENT:  Head: Normocephalic and atraumatic.  Eyes: Conjunctivae are normal. Right eye exhibits no discharge. Left eye exhibits no discharge. No scleral icterus.  Neck: Normal range of motion.  Cardiovascular: Normal rate and regular rhythm.  Pulmonary/Chest: Effort normal. No stridor. No  respiratory distress.  Abdominal: She exhibits no distension.  Musculoskeletal: She exhibits no edema or deformity.  Neurological: She is alert and oriented to person, place, and time. She exhibits normal muscle tone.  No facial droop, spontaneously moves all extremities, ambulatory with normal gait, speech is normal, not slurred.    Skin: Skin is warm and dry. She is not diaphoretic.  Psychiatric: She has a normal mood and affect. Her behavior is normal.  Nursing note and vitals reviewed.    ED Treatments / Results  Labs (all labs ordered are listed, but only abnormal results are displayed) Labs Reviewed  URINALYSIS, ROUTINE W REFLEX MICROSCOPIC - Abnormal; Notable for the following components:      Result Value   APPearance CLOUDY (*)    Hgb urine dipstick SMALL (*)    All other components within normal limits  BASIC METABOLIC PANEL - Abnormal; Notable for the following components:   Glucose, Bld 111 (*)    All other components within normal limits  URINALYSIS, MICROSCOPIC (REFLEX) - Abnormal; Notable for the following components:   Bacteria, UA RARE (*)    All other components within normal limits  PREGNANCY, URINE  CBC WITH DIFFERENTIAL/PLATELET    EKG None  Radiology No results found.  Procedures Procedures (including critical care time)  Medications Ordered in ED Medications - No data to display   Initial Impression / Assessment and Plan / ED Course  I have reviewed the triage vital signs and the nursing notes.  Pertinent labs & imaging results that were available during my care of the patient were reviewed by me and considered in my medical decision making (see chart for details).    Patient presents today for evaluation of an abnormal feeling that lasted approximately 10 seconds and then resolved without intervention.  She has a history of migraines and recently changed her medication on Wednesday from Topamax to Trokendi XR.  She was observed for multiple  hours in the emergency room without repeat episode.  Labs are obtained and reviewed, she is not anemic and does not have any significant electrolyte imbalances.  Attempted to contact her neurologist, however they are closed today.  This patient was discussed with Dr. Anitra Lauth.  Recommended not to drive or perform other dangerous activities. It does not sound like a true seizure, question absence seizure, however she remained aware of what was going on.    Instructed her to contact her neurologist.    Return precautions were discussed with patient who states their understanding.  At the time of discharge patient denied any unaddressed complaints or concerns.  Patient is agreeable for discharge home.   Final Clinical Impressions(s) / ED Diagnoses   Final diagnoses:  Abnormal feeling    ED Discharge Orders    None  Cristina Gong, PA-C 09/25/18 2136    Gwyneth Sprout, MD 09/26/18 1513

## 2019-12-16 ENCOUNTER — Ambulatory Visit: Payer: HRSA Program | Attending: Internal Medicine

## 2019-12-16 DIAGNOSIS — Z20828 Contact with and (suspected) exposure to other viral communicable diseases: Secondary | ICD-10-CM | POA: Insufficient documentation

## 2019-12-16 DIAGNOSIS — Z20822 Contact with and (suspected) exposure to covid-19: Secondary | ICD-10-CM

## 2019-12-17 LAB — NOVEL CORONAVIRUS, NAA: SARS-CoV-2, NAA: NOT DETECTED

## 2020-01-03 ENCOUNTER — Ambulatory Visit: Payer: Self-pay | Attending: Internal Medicine

## 2020-01-03 DIAGNOSIS — Z20822 Contact with and (suspected) exposure to covid-19: Secondary | ICD-10-CM | POA: Insufficient documentation

## 2020-01-05 LAB — NOVEL CORONAVIRUS, NAA: SARS-CoV-2, NAA: NOT DETECTED

## 2020-08-08 ENCOUNTER — Other Ambulatory Visit: Payer: Self-pay

## 2020-11-29 ENCOUNTER — Ambulatory Visit (HOSPITAL_BASED_OUTPATIENT_CLINIC_OR_DEPARTMENT_OTHER): Admit: 2020-11-29 | Payer: Self-pay | Admitting: Obstetrics and Gynecology

## 2020-11-29 ENCOUNTER — Encounter (HOSPITAL_BASED_OUTPATIENT_CLINIC_OR_DEPARTMENT_OTHER): Payer: Self-pay

## 2020-11-29 SURGERY — LAPAROSCOPIC GELPORT ASSISTED MYOMECTOMY
Anesthesia: General

## 2024-01-29 DIAGNOSIS — Z01419 Encounter for gynecological examination (general) (routine) without abnormal findings: Secondary | ICD-10-CM | POA: Diagnosis not present

## 2024-01-29 DIAGNOSIS — Z124 Encounter for screening for malignant neoplasm of cervix: Secondary | ICD-10-CM | POA: Diagnosis not present

## 2024-01-29 DIAGNOSIS — Z1231 Encounter for screening mammogram for malignant neoplasm of breast: Secondary | ICD-10-CM | POA: Diagnosis not present

## 2024-03-04 DIAGNOSIS — Z0000 Encounter for general adult medical examination without abnormal findings: Secondary | ICD-10-CM | POA: Diagnosis not present

## 2024-03-04 DIAGNOSIS — D649 Anemia, unspecified: Secondary | ICD-10-CM | POA: Diagnosis not present

## 2024-03-04 DIAGNOSIS — E559 Vitamin D deficiency, unspecified: Secondary | ICD-10-CM | POA: Diagnosis not present

## 2024-03-04 DIAGNOSIS — R7303 Prediabetes: Secondary | ICD-10-CM | POA: Diagnosis not present

## 2024-03-04 DIAGNOSIS — Z13220 Encounter for screening for lipoid disorders: Secondary | ICD-10-CM | POA: Diagnosis not present

## 2024-03-10 DIAGNOSIS — G4489 Other headache syndrome: Secondary | ICD-10-CM | POA: Diagnosis not present

## 2024-03-10 DIAGNOSIS — E781 Pure hyperglyceridemia: Secondary | ICD-10-CM | POA: Diagnosis not present

## 2024-03-10 DIAGNOSIS — R946 Abnormal results of thyroid function studies: Secondary | ICD-10-CM | POA: Diagnosis not present

## 2024-03-10 DIAGNOSIS — E1165 Type 2 diabetes mellitus with hyperglycemia: Secondary | ICD-10-CM | POA: Diagnosis not present

## 2024-03-10 DIAGNOSIS — Z8269 Family history of other diseases of the musculoskeletal system and connective tissue: Secondary | ICD-10-CM | POA: Diagnosis not present

## 2024-03-10 DIAGNOSIS — E66811 Obesity, class 1: Secondary | ICD-10-CM | POA: Diagnosis not present

## 2024-03-10 DIAGNOSIS — M2550 Pain in unspecified joint: Secondary | ICD-10-CM | POA: Diagnosis not present

## 2024-04-19 ENCOUNTER — Other Ambulatory Visit (HOSPITAL_BASED_OUTPATIENT_CLINIC_OR_DEPARTMENT_OTHER): Payer: Self-pay

## 2024-04-19 MED ORDER — OZEMPIC (0.25 OR 0.5 MG/DOSE) 2 MG/3ML ~~LOC~~ SOPN
0.5000 mg | PEN_INJECTOR | SUBCUTANEOUS | 0 refills | Status: AC
  Filled 2024-04-19: qty 3, 28d supply, fill #0

## 2024-04-20 ENCOUNTER — Other Ambulatory Visit (HOSPITAL_BASED_OUTPATIENT_CLINIC_OR_DEPARTMENT_OTHER): Payer: Self-pay

## 2024-06-21 ENCOUNTER — Other Ambulatory Visit (HOSPITAL_COMMUNITY): Payer: Self-pay
